# Patient Record
Sex: Female | Born: 1985 | Race: White | Hispanic: No | Smoking: Current every day smoker
Health system: Southern US, Community
[De-identification: ages and names within clinical notes are randomized; demographics above are authoritative.]

## PROBLEM LIST (undated history)

## (undated) DIAGNOSIS — F319 Bipolar disorder, unspecified: Secondary | ICD-10-CM

## (undated) DIAGNOSIS — F419 Anxiety disorder, unspecified: Secondary | ICD-10-CM

## (undated) DIAGNOSIS — F3181 Bipolar II disorder: Secondary | ICD-10-CM

## (undated) DIAGNOSIS — Z720 Tobacco use: Secondary | ICD-10-CM

## (undated) DIAGNOSIS — G43909 Migraine, unspecified, not intractable, without status migrainosus: Secondary | ICD-10-CM

## (undated) HISTORY — DX: Bipolar II disorder: F31.81

## (undated) HISTORY — DX: Migraine, unspecified, not intractable, without status migrainosus: G43.909

## (undated) HISTORY — DX: Tobacco use: Z72.0

## (undated) HISTORY — DX: Anxiety disorder, unspecified: F41.9

---
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
## 2007-05-06 ENCOUNTER — Emergency Department (HOSPITAL_COMMUNITY): Admission: EM | Admit: 2007-05-06 | Discharge: 2007-05-06 | Payer: Self-pay | Admitting: Emergency Medicine

## 2010-03-13 ENCOUNTER — Inpatient Hospital Stay (INDEPENDENT_AMBULATORY_CARE_PROVIDER_SITE_OTHER)
Admission: RE | Admit: 2010-03-13 | Discharge: 2010-03-13 | Disposition: A | Discharge status: Home / Self Care | Payer: Self-pay | Source: Ambulatory Visit | Attending: Emergency Medicine | Admitting: Emergency Medicine

## 2010-03-13 DIAGNOSIS — M79609 Pain in unspecified limb: Secondary | ICD-10-CM

## 2010-03-13 LAB — POCT I-STAT, CHEM 8
BUN: 11 mg/dL (ref 6–23)
Calcium, Ion: 1.18 mmol/L (ref 1.12–1.32)
Chloride: 105 mEq/L (ref 96–112)
Creatinine, Ser: 0.7 mg/dL (ref 0.4–1.2)
Glucose, Bld: 92 mg/dL (ref 70–99)
HCT: 38 % (ref 36.0–46.0)
Hemoglobin: 12.9 g/dL (ref 12.0–15.0)
Potassium: 3.9 mEq/L (ref 3.5–5.1)
Sodium: 141 mEq/L (ref 135–145)
TCO2: 22 mmol/L (ref 0–100)

## 2010-04-03 ENCOUNTER — Emergency Department (HOSPITAL_COMMUNITY): Payer: Self-pay

## 2010-04-03 ENCOUNTER — Emergency Department (HOSPITAL_COMMUNITY)
Admission: EM | Admit: 2010-04-03 | Discharge: 2010-04-03 | Disposition: A | Discharge status: Home / Self Care | Payer: Self-pay | Attending: Emergency Medicine | Admitting: Emergency Medicine

## 2010-04-03 DIAGNOSIS — R079 Chest pain, unspecified: Secondary | ICD-10-CM | POA: Insufficient documentation

## 2010-04-03 DIAGNOSIS — F17200 Nicotine dependence, unspecified, uncomplicated: Secondary | ICD-10-CM | POA: Insufficient documentation

## 2010-04-03 DIAGNOSIS — J069 Acute upper respiratory infection, unspecified: Secondary | ICD-10-CM | POA: Insufficient documentation

## 2010-04-03 DIAGNOSIS — B9789 Other viral agents as the cause of diseases classified elsewhere: Secondary | ICD-10-CM | POA: Insufficient documentation
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
## 2010-11-07 ENCOUNTER — Emergency Department (HOSPITAL_BASED_OUTPATIENT_CLINIC_OR_DEPARTMENT_OTHER)
Admission: EM | Admit: 2010-11-07 | Discharge: 2010-11-08 | Disposition: A | Discharge status: Home / Self Care | Payer: BC Managed Care – PPO | Attending: Emergency Medicine | Admitting: Emergency Medicine

## 2010-11-07 ENCOUNTER — Encounter: Payer: Self-pay | Admitting: *Deleted

## 2010-11-07 ENCOUNTER — Emergency Department (INDEPENDENT_AMBULATORY_CARE_PROVIDER_SITE_OTHER): Payer: BC Managed Care – PPO

## 2010-11-07 DIAGNOSIS — R0989 Other specified symptoms and signs involving the circulatory and respiratory systems: Secondary | ICD-10-CM

## 2010-11-07 DIAGNOSIS — J40 Bronchitis, not specified as acute or chronic: Secondary | ICD-10-CM | POA: Insufficient documentation

## 2010-11-07 DIAGNOSIS — R509 Fever, unspecified: Secondary | ICD-10-CM | POA: Insufficient documentation

## 2010-11-07 DIAGNOSIS — R05 Cough: Secondary | ICD-10-CM

## 2010-11-07 DIAGNOSIS — F319 Bipolar disorder, unspecified: Secondary | ICD-10-CM | POA: Insufficient documentation

## 2010-11-07 HISTORY — DX: Bipolar disorder, unspecified: F31.9

## 2010-11-07 LAB — PREGNANCY, URINE: Preg Test, Ur: NEGATIVE

## 2010-11-07 MED ORDER — ALBUTEROL SULFATE HFA 108 (90 BASE) MCG/ACT IN AERS
2.0000 | INHALATION_SPRAY | Freq: Once | RESPIRATORY_TRACT | Status: AC
  Administered 2010-11-07: 2 via RESPIRATORY_TRACT
  Filled 2010-11-07: qty 6.7

## 2010-11-07 NOTE — ED Notes (Signed)
Pt reports nausea, cough, fever, diarrhea x 2-3 days-
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
## 2010-11-08 LAB — URINALYSIS, ROUTINE W REFLEX MICROSCOPIC
Nitrite: NEGATIVE
Protein, ur: NEGATIVE mg/dL
Urobilinogen, UA: 1 mg/dL (ref 0.0–1.0)

## 2010-11-08 LAB — URINE MICROSCOPIC-ADD ON

## 2010-11-08 MED ORDER — BENZONATATE 100 MG PO CAPS: 100.0000 mg | ORAL_CAPSULE | Freq: Three times a day (TID) | ORAL | Status: AC | PRN

## 2010-11-08 MED ORDER — BENZONATATE 100 MG PO CAPS: 100.0000 mg | ORAL_CAPSULE | Freq: Three times a day (TID) | ORAL | Status: DC | PRN

## 2010-11-08 MED ORDER — AZITHROMYCIN 250 MG PO TABS: 250.0000 mg | ORAL_TABLET | Freq: Every day | ORAL | Status: AC

## 2010-11-08 MED ORDER — AZITHROMYCIN 250 MG PO TABS: 250.0000 mg | ORAL_TABLET | Freq: Every day | ORAL | Status: DC

## 2011-01-06 ENCOUNTER — Encounter: Payer: Self-pay | Admitting: Family Medicine

## 2011-01-19 ENCOUNTER — Ambulatory Visit (INDEPENDENT_AMBULATORY_CARE_PROVIDER_SITE_OTHER): Payer: BC Managed Care – PPO | Admitting: Family Medicine

## 2011-01-19 ENCOUNTER — Encounter: Payer: Self-pay | Admitting: Family Medicine

## 2011-01-19 VITALS — BP 138/84 | HR 81 | Ht 66.0 in | Wt 145.0 lb

## 2011-01-19 DIAGNOSIS — R5383 Other fatigue: Secondary | ICD-10-CM

## 2011-01-19 DIAGNOSIS — F3181 Bipolar II disorder: Secondary | ICD-10-CM

## 2011-01-19 DIAGNOSIS — R5381 Other malaise: Secondary | ICD-10-CM

## 2011-01-19 DIAGNOSIS — IMO0001 Reserved for inherently not codable concepts without codable children: Secondary | ICD-10-CM

## 2011-01-19 DIAGNOSIS — Z975 Presence of (intrauterine) contraceptive device: Secondary | ICD-10-CM

## 2011-01-19 DIAGNOSIS — M21612 Bunion of left foot: Secondary | ICD-10-CM | POA: Insufficient documentation

## 2011-01-19 DIAGNOSIS — F17200 Nicotine dependence, unspecified, uncomplicated: Secondary | ICD-10-CM

## 2011-01-19 DIAGNOSIS — M21619 Bunion of unspecified foot: Secondary | ICD-10-CM

## 2011-01-19 DIAGNOSIS — F3189 Other bipolar disorder: Secondary | ICD-10-CM

## 2011-01-19 DIAGNOSIS — Z720 Tobacco use: Secondary | ICD-10-CM

## 2011-01-19 HISTORY — DX: Bipolar II disorder: F31.81

## 2011-01-19 HISTORY — DX: Tobacco use: Z72.0

## 2011-01-19 LAB — CBC
HCT: 41 % (ref 36.0–46.0)
Hemoglobin: 13.5 g/dL (ref 12.0–15.0)
MCHC: 32.9 g/dL (ref 30.0–36.0)

## 2011-01-19 MED ORDER — LAMOTRIGINE 25 & 50 & 100 MG PO KIT: 100.0000 mg | PACK | Freq: Every day | ORAL | Status: DC

## 2011-01-19 MED ORDER — NICOTINE 7 MG/24HR TD PT24: 1.0000 | MEDICATED_PATCH | TRANSDERMAL | Status: AC

## 2011-01-19 NOTE — Assessment & Plan Note (Signed)
Patient is bunion on the left lateral aspect of her foot. Secondary to patient having a breakdown of her transverse arch. Patient was recommended to try some Spenco orthotics but she can get at home and to sports or online. Patient will try these in her work boots and see if it makes a significant improvement. Patient also given a bunion pad to try to wear to see that actually is if any benefit. Reassess in 2 weeks.

## 2011-01-19 NOTE — Assessment & Plan Note (Signed)
Patient is wanting to quit not ready to put a quit date though. Patient will be given nicotine patch. Patient will not be given Wellbutrin or Gentex secondary to her psychological condition. Patient though knows she needs to quit to have his ICD removed and be started on oral contraceptives.

## 2011-01-19 NOTE — Patient Instructions (Signed)
Very good to see you. I am giving you a Lamictal starting pack. Take as stated. We will get a complete blood count today. I will call you with the results You can always get Spenco orthotics these are available at [employer] I'm giving you a nicotine patch to try. I want to see you again in 2-4 weeks to make sure everything is going well. At that time please set this up for her Mirena removal and Pap smear.

## 2011-01-19 NOTE — Assessment & Plan Note (Signed)
Patient gives history of this. Didn't seem very concerned and very genuine. At this time I do think a mood stabilizer would be of benefit and will start Lamictal and start her kits dosing. Patient knows about the rash and what to look out for. In addition we will get a baseline CBC to make sure everything is fine. We'll have patient come back in the next 2-4 weeks for IUD removal discuss contraception and make sure medication is treating her well.

## 2011-01-19 NOTE — Progress Notes (Signed)
Subjective:    Patient ID: Monica Mcdonald, female    DOB: 1985-03-03, 26 y.o.   MRN: [id number]  HPI 26 year old female here to establish care. Patient is going to school in [place] previously and now has moved back to [place] to be closer to family. 1.  Pt IUD is almost expired, she would not like another one but would like to go back on the pill.  Pt though is still smoking 1/2 ppd.  Pt states that it is sometimes painful during intercourse and would not like it again. Pt has not had periods since having it put in and otherwise feels healthy pt is not planning on having anymore kids.  2. Tobacco abuse- Still smoking 1/2 ppd would like to quit and would like options, pt has a four year child she would like to quit for. Pt unable to set a quit date, has not tried to quit for real ever.  3.  Bipolar 2 disorder.  Pt has not bene on any medication for 2 years.  Diagnosed in [place] no manic episodes but feels down overall.  Pt states she has not had as much energy a she used to have and  Sometimes feels tearful though everything is good in her life. Pt denies  Suicidal and Homicidal ideation .  Pt would be interested in a mood stabilizer.    Review of Systems Denies fever, chills, nausea vomiting abdominal pain, dysuria, chest pain, shortness of breath dyspnea on exertion or numbness in extremities Past Medical History  Diagnosis Date  . Bipolar 1 disorder   . Migraines   . Bipolar 2 disorder 01/19/2011  . Tobacco abuse 01/19/2011  . Anxiety   . Migraines 2008   History reviewed. No pertinent past surgical history. History   Social History  . Marital Status: Divorced    Spouse Name: N/A    Number of Children: N/A  . Years of Education: N/A   Occupational History  . Not on file.   Social History Main Topics  . Smoking status: Current Everyday Smoker  . Smokeless tobacco: Never Used  . Alcohol Use: 2.0 oz/week    4 drink(s) per week  . Drug Use: No  . Sexually Active: Yes   Birth Control/ Protection: IUD   Other Topics Concern  . Not on file   Social History Narrative   Patient works for [employer]. Has a son born in 2008       Objective:   Physical Exam  Gen: NAD alert pleasant affect normal CV: RRR no murmur Pul: CTAB Abd: BS + NT Ext: no edema Left foot: has bunion on left fifth toe lateral, tender to palpation no erythema no signs of infection or skin breakdown.       Assessment & Plan:  Bipolar 2 disorder Patient gives history of this. Didn't seem very concerned and very genuine. At this time I do think a mood stabilizer would be of benefit and will start Lamictal and start her kits dosing. Patient knows about the rash and what to look out for. In addition we will get a baseline CBC to make sure everything is fine. We'll have patient come back in the next 2-4 weeks for IUD removal discuss contraception and make sure medication is treating her well.  Tobacco abuse Patient is wanting to quit not ready to put a quit date though. Patient will be given nicotine patch. Patient will not be given Wellbutrin or Gentex secondary to her psychological condition. Patient though knows  she needs to quit to have his ICD removed and be started on oral contraceptives.  Bunion of left foot Patient is bunion on the left lateral aspect of her foot. Secondary to patient having a breakdown of her transverse arch. Patient was recommended to try some Spenco orthotics but she can get at home and to sports or online. Patient will try these in her work boots and see if it makes a significant improvement. Patient also given a bunion pad to try to wear to see that actually is if any benefit. Reassess in 2 weeks.

## 2011-02-01 ENCOUNTER — Telehealth: Payer: Self-pay | Admitting: Family Medicine

## 2011-02-01 NOTE — Telephone Encounter (Signed)
Was told if she got a rash to call - she had been put on lamictal.  She has discovered a rash behind her ear, but it doesn't itch or burn

## 2011-02-01 NOTE — Telephone Encounter (Addendum)
Returned call to patient.  States she has a rash behind left ear.  Rash is 1.5" x 1.5", has bumps, and is slightly pink.  Denies itching, pain, or oozing. Also, denies facial swelling or shortness of breath.   Patient is not due to take Lamictal again until tomorrow morning.   Patient will wait to take medication after she hears back from our office.  Will check with Dr. Katrinka Blazing in morning and call patient back.  Gaylene Brooks, RN

## 2011-02-02 NOTE — Telephone Encounter (Signed)
Agreed with documentation, would be weird area for drug reaction rash, but would be conservative and if completely new then would need to stop medication and see how she does without it.

## 2011-02-02 NOTE — Telephone Encounter (Signed)
Spoke with Dr. Katrinka Blazing.  Will check with patient to see if she has ever had this type of rash.  If not, have patient hold med for 3-5 days to see if rash resolves.  Called patient back and left message on voicemail to call our office.  Gaylene Brooks, RN

## 2011-02-02 NOTE — Telephone Encounter (Signed)
Called patient again.  Patient states she had eczema as a child and does not think this is the same type of rash.  Instructed patient to hold Lamictal and see if rash resolves.  Patient will call office back on Friday (02/05/11) with status of rash and to see whether she needs to resume her med.  Gaylene Brooks, RN

## 2011-02-08 NOTE — Telephone Encounter (Signed)
Pt called back and said that rash had resolved, not sure what she should do now.  pls advise

## 2011-02-08 NOTE — Telephone Encounter (Signed)
Spoke with patient and she states rash is not completely gone but is probably 75 % better. Still itches a little. Will send message to Dr. Katrinka Blazing to please advise .

## 2011-02-10 NOTE — Telephone Encounter (Signed)
Called patient back a message stating that she did attempt to try to restart the medication and see if the rash starts again and then we'll discontinue. We'll not make allergy to the medication at this point and her chart thinking that this was unrelated with such a weird distribution.

## 2011-02-26 ENCOUNTER — Other Ambulatory Visit (HOSPITAL_COMMUNITY)
Admission: RE | Admit: 2011-02-26 | Discharge: 2011-02-26 | Disposition: A | Discharge status: Home / Self Care | Payer: BC Managed Care – PPO | Source: Ambulatory Visit | Attending: Family Medicine | Admitting: Family Medicine

## 2011-02-26 ENCOUNTER — Encounter: Payer: Self-pay | Admitting: Family Medicine

## 2011-02-26 ENCOUNTER — Ambulatory Visit (INDEPENDENT_AMBULATORY_CARE_PROVIDER_SITE_OTHER): Payer: BC Managed Care – PPO | Admitting: Family Medicine

## 2011-02-26 VITALS — BP 120/68 | HR 64 | Temp 98.7°F | Ht 66.0 in | Wt 142.1 lb

## 2011-02-26 DIAGNOSIS — Z124 Encounter for screening for malignant neoplasm of cervix: Secondary | ICD-10-CM

## 2011-02-26 DIAGNOSIS — F17200 Nicotine dependence, unspecified, uncomplicated: Secondary | ICD-10-CM

## 2011-02-26 DIAGNOSIS — Z113 Encounter for screening for infections with a predominantly sexual mode of transmission: Secondary | ICD-10-CM | POA: Insufficient documentation

## 2011-02-26 DIAGNOSIS — Z975 Presence of (intrauterine) contraceptive device: Secondary | ICD-10-CM

## 2011-02-26 DIAGNOSIS — Z720 Tobacco use: Secondary | ICD-10-CM

## 2011-02-26 DIAGNOSIS — F3181 Bipolar II disorder: Secondary | ICD-10-CM

## 2011-02-26 DIAGNOSIS — IMO0001 Reserved for inherently not codable concepts without codable children: Secondary | ICD-10-CM

## 2011-02-26 DIAGNOSIS — Z01419 Encounter for gynecological examination (general) (routine) without abnormal findings: Secondary | ICD-10-CM | POA: Insufficient documentation

## 2011-02-26 DIAGNOSIS — N760 Acute vaginitis: Secondary | ICD-10-CM

## 2011-02-26 DIAGNOSIS — F3189 Other bipolar disorder: Secondary | ICD-10-CM

## 2011-02-26 MED ORDER — NORETHINDRONE 0.35 MG PO TABS: 1.0000 | ORAL_TABLET | Freq: Every day | ORAL | Status: DC

## 2011-02-26 NOTE — Assessment & Plan Note (Signed)
Still smoking patient declined any counseling.

## 2011-02-26 NOTE — Progress Notes (Signed)
  Subjective:    Patient ID: Monica Mcdonald, female    DOB: [birth year], 26 y.o.   MRN: [id number]  HPI 26 year old female here today for an IUD removal as well as to discuss her bipolar 2 disorder. #1 IUD removal patient is a greater for 5 years it is time to be removed. Patient would like to not continue at this time and would like to go to oral contraceptives. Patient does smoke quarter pack per day and is still trying to cut back even more. #2 bipolar 2 disorder. Patient has had this diagnosed previously was put on Lamictal but there is a potential that she actually started to have a rash behind her ears bilaterally. She discontinued the medication for 4 days rash improved minorly but not completely, she started the Lamictal again and at that time patient has had no other side effects or any rash. Patient is on 50 mg a day and is going to continue to try to increase up to the 100 mg daily. Patient states that she's been feeling well denies any suicidal or homicidal ideation.  Review of Systems Denies fever, chills, nausea vomiting abdominal pain, dysuria, chest pain, shortness of breath dyspnea on exertion or numbness in extremities     Objective:   Physical Exam  Constitutional: She is oriented to person, place, and time. She appears well-developed.  HENT:  Head: Normocephalic.  Mouth/Throat: No oropharyngeal exudate.  Eyes: Conjunctivae are normal. Pupils are equal, round, and reactive to light.  Neck: Normal range of motion. Neck supple. No thyromegaly present.  Cardiovascular: Normal rate, regular rhythm and normal heart sounds.   Pulmonary/Chest: Effort normal and breath sounds normal.  Abdominal: Soft. Bowel sounds are normal. She exhibits no distension.  Genitourinary: Vagina normal and uterus normal.       Speculum placed patient has very normal appearing vaginal tissue cervix identified, patient Mirena strings are in place but has a bowl of straining and shows that it was not cut  during insertion.Pap collected, Mirena removed. Patient tolerated procedure well.  Musculoskeletal: Normal range of motion.  Neurological: She is alert and oriented to person, place, and time. She has normal reflexes.          Assessment & Plan:

## 2011-02-26 NOTE — Patient Instructions (Signed)
It is great to see you. I am glad to the Lamictalis helping.I will refill it when it is time. I have giving you a medicine called Micronor. Take one pill daily at the same time. I should probably see you again in about one to 2 months to make sure the [place] is still doing well.

## 2011-02-26 NOTE — Assessment & Plan Note (Signed)
Discussed with patient again at this time patient is doing very well with the Lamictal and increasing without any side effects. We'll continue to monitor for rash gave patient red flags and when to seek medical attention. Patient will followup with me in one month's time to make sure still doing well.

## 2011-03-31 ENCOUNTER — Other Ambulatory Visit: Payer: Self-pay | Admitting: Family Medicine

## 2011-03-31 MED ORDER — LAMOTRIGINE 100 MG PO TABS: 100.0000 mg | ORAL_TABLET | Freq: Every day | ORAL | Status: DC

## 2011-04-26 ENCOUNTER — Ambulatory Visit: Payer: BC Managed Care – PPO | Admitting: Family Medicine

## 2011-08-31 ENCOUNTER — Encounter (HOSPITAL_COMMUNITY): Payer: Self-pay | Admitting: Emergency Medicine

## 2011-08-31 ENCOUNTER — Emergency Department (HOSPITAL_COMMUNITY)
Admission: EM | Admit: 2011-08-31 | Discharge: 2011-08-31 | Disposition: A | Discharge status: Home / Self Care | Payer: Worker's Compensation | Attending: Emergency Medicine | Admitting: Emergency Medicine

## 2011-08-31 ENCOUNTER — Emergency Department (HOSPITAL_COMMUNITY): Payer: Worker's Compensation

## 2011-08-31 DIAGNOSIS — F319 Bipolar disorder, unspecified: Secondary | ICD-10-CM | POA: Insufficient documentation

## 2011-08-31 DIAGNOSIS — S93409A Sprain of unspecified ligament of unspecified ankle, initial encounter: Secondary | ICD-10-CM | POA: Insufficient documentation

## 2011-08-31 DIAGNOSIS — S93402A Sprain of unspecified ligament of left ankle, initial encounter: Secondary | ICD-10-CM

## 2011-08-31 DIAGNOSIS — F17200 Nicotine dependence, unspecified, uncomplicated: Secondary | ICD-10-CM | POA: Insufficient documentation

## 2011-08-31 DIAGNOSIS — Z79899 Other long term (current) drug therapy: Secondary | ICD-10-CM | POA: Insufficient documentation

## 2011-08-31 DIAGNOSIS — W19XXXA Unspecified fall, initial encounter: Secondary | ICD-10-CM | POA: Insufficient documentation

## 2011-08-31 MED ORDER — HYDROCODONE-ACETAMINOPHEN 5-325 MG PO TABS
1.0000 | ORAL_TABLET | Freq: Once | ORAL | Status: AC
  Administered 2011-08-31: 1 via ORAL
  Filled 2011-08-31: qty 1

## 2011-08-31 MED ORDER — HYDROCODONE-ACETAMINOPHEN 5-325 MG PO TABS: 1.0000 | ORAL_TABLET | Freq: Four times a day (QID) | ORAL | Status: AC | PRN

## 2011-08-31 MED ORDER — IBUPROFEN 800 MG PO TABS: 800.0000 mg | ORAL_TABLET | Freq: Three times a day (TID) | ORAL | Status: AC

## 2011-08-31 MED ORDER — IBUPROFEN 800 MG PO TABS
800.0000 mg | ORAL_TABLET | Freq: Once | ORAL | Status: AC
  Administered 2011-08-31: 800 mg via ORAL
  Filled 2011-08-31: qty 1

## 2011-08-31 NOTE — ED Notes (Signed)
Rx given x2 D/c instructions reviewed w/ pt - pt denies any further questions or concerns at present.  Pt ambulating w/ assistance of crutches - pt in no acute distress - A&Ox4.

## 2011-08-31 NOTE — ED Provider Notes (Signed)
History     CSN: [id number]  Arrival date & time 08/31/11  2007   First MD Initiated Contact with Patient 08/31/11 2229      Chief Complaint  Patient presents with  . Ankle Injury    (Consider location/radiation/quality/duration/timing/severity/associated sxs/prior treatment) Patient is a 26 y.o. female presenting with lower extremity injury. The history is provided by the patient.  Ankle Injury This is a new problem. The current episode started today. The problem occurs constantly. The problem has been unchanged. Associated symptoms include joint swelling. Pertinent negatives include no abdominal pain, chest pain, chills, congestion, diaphoresis, fever, headaches, myalgias, nausea, numbness, rash, sore throat or weakness. Nothing aggravates the symptoms. She has tried nothing for the symptoms.    Past Medical History  Diagnosis Date  . Bipolar 1 disorder   . Migraines   . Bipolar 2 disorder 01/19/2011  . Tobacco abuse 01/19/2011  . Anxiety   . Migraines 2008    History reviewed. No pertinent past surgical history.  Family History  Problem Relation Age of Onset  . Alcohol abuse Maternal Grandmother   . Heart failure Maternal Grandmother   . Hyperlipidemia Maternal Grandmother   . Hypertension Maternal Grandmother   . Hypertension Mother   . Hyperlipidemia Mother   . Hyperlipidemia Father   . Hypertension Father   . Diabetes Mother     mother is controlling with diet  . Stroke Maternal Grandmother   . Thyroid disease Maternal Grandmother   . Cancer Neg Hx     History  Substance Use Topics  . Smoking status: Current Everyday Smoker -- 0.3 packs/day  . Smokeless tobacco: Never Used  . Alcohol Use: 2.0 oz/week    4 drink(s) per week     occ    OB History    Grav Para Term Preterm Abortions TAB SAB Ect Mult Living                  Review of Systems  Constitutional: Negative for fever, chills, diaphoresis and appetite change.  HENT: Negative for congestion  and sore throat.   Eyes: Negative for visual disturbance.  Respiratory: Negative for shortness of breath.   Cardiovascular: Negative for chest pain and leg swelling.  Gastrointestinal: Negative for nausea and abdominal pain.  Genitourinary: Negative for dysuria, urgency and frequency.  Musculoskeletal: Positive for joint swelling and gait problem. Negative for myalgias.  Skin: Negative for rash.  Neurological: Negative for dizziness, syncope, weakness, light-headedness, numbness and headaches.  Psychiatric/Behavioral: Negative for confusion.  All other systems reviewed and are negative.    Allergies  Penicillins cross reactors and Naproxen sodium  Home Medications   Current Outpatient Rx  Name Route Sig Dispense Refill  . LAMOTRIGINE 100 MG PO TABS Oral Take 1 tablet (100 mg total) by mouth daily. 90 tablet 1  . NORETHINDRONE 0.35 MG PO TABS Oral Take 1 tablet (0.35 mg total) by mouth daily. 3 Package 3  . HYDROCODONE-ACETAMINOPHEN 5-325 MG PO TABS Oral Take 1 tablet by mouth every 6 (six) hours as needed for pain. 15 tablet 0  . IBUPROFEN 800 MG PO TABS Oral Take 1 tablet (800 mg total) by mouth 3 (three) times daily. 21 tablet 0    BP 126/82  Pulse 84  Temp 98.3 F (36.8 C) (Oral)  Resp 18  SpO2 100%  LMP 08/05/2011  Physical Exam  Nursing note and vitals reviewed. Constitutional: She is oriented to person, place, and time. She appears well-developed and well-nourished. No distress.  HENT:  Head: Normocephalic and atraumatic.  Eyes: Conjunctivae and EOM are normal.  Neck: Normal range of motion.  Cardiovascular:       Intact distal pulses  Pulmonary/Chest: Effort normal.  Musculoskeletal: Normal range of motion.       Tenderness to palpation over lateral malleolus.  Mild swelling of the ankle joint.  Pain with inversion and eversion of ankle.  Normal range of motion of toes.  No tenderness to palpation over metatarsal region. No proximal fibula tenderness    Neurological: She is alert and oriented to person, place, and time.       Strength 5 out of 5 in extremities bilaterally. sensation intact  Skin: Skin is warm and dry. No rash noted. She is not diaphoretic.  Psychiatric: She has a normal mood and affect. Her behavior is normal.    ED Course  Procedures (including critical care time)  Labs Reviewed - No data to display Dg Ankle Complete Left  08/31/2011  *RADIOLOGY REPORT*  Clinical Data: Fall, left lateral ankle pain  LEFT ANKLE COMPLETE - 3+ VIEW  Comparison: None.  Findings: Mild soft tissue swelling about the anterior ankle and lateral malleolus without evidence of underlying acute fracture, or malalignment.  The ankle mortise is congruent.  No ankle joint effusion.  IMPRESSION:  Soft tissue swelling of the anterolateral ankle without evidence of underlying fracture, or malalignment.   Original Report Authenticated By: HEATH      1. Left ankle sprain       MDM  Ankle sprain  Patient X-Ray negative for obvious fracture or dislocation. Pain managed in ED. Pt advised to follow up with orthopedics if symptoms persist for possibility of missed fracture diagnosis. Patient given brace while in ED, conservative therapy recommended and discussed. Patient will be dc home & is agreeable with above plan.         Jaci Carrel, [place] 08/31/11 2340

## 2011-08-31 NOTE — ED Notes (Signed)
Pt states she was caught her foot in a crack on the sidewalk and rolled her left foot  Pt states she felt and heard it crack  Pt unable to bear weight on it  Pt has swelling noted to the outside of her left ankle  Ice pack in place

## 2011-08-31 NOTE — ED Notes (Signed)
Pt reports while at work her left foot got caught in a crack in the ground causing her to over extend her foot and injure her left ankle. Pt w/o any obvious deformity or swelling on assessment.

## 2011-09-01 NOTE — ED Provider Notes (Signed)
Medical screening examination/treatment/procedure(s) were performed by non-physician practitioner and as supervising physician I was immediately available for consultation/collaboration.  Georgi Tuel R. Mirissa Lopresti, MD 09/01/11 0007 

## 2011-09-15 ENCOUNTER — Encounter: Payer: Self-pay | Admitting: Family Medicine

## 2011-09-15 ENCOUNTER — Ambulatory Visit (INDEPENDENT_AMBULATORY_CARE_PROVIDER_SITE_OTHER): Payer: BC Managed Care – PPO | Admitting: Family Medicine

## 2011-09-15 VITALS — BP 130/85 | HR 89 | Temp 97.9°F

## 2011-09-15 DIAGNOSIS — Z309 Encounter for contraceptive management, unspecified: Secondary | ICD-10-CM

## 2011-09-15 MED ORDER — TRAMADOL HCL 50 MG PO TABS: 50.0000 mg | ORAL_TABLET | Freq: Three times a day (TID) | ORAL | Status: AC | PRN

## 2011-09-15 MED ORDER — LEVONORGESTREL 20 MCG/24HR IU IUD
INTRAUTERINE_SYSTEM | Freq: Once | INTRAUTERINE | Status: AC
  Administered 2011-09-15: 1 via INTRAUTERINE

## 2011-09-15 NOTE — Patient Instructions (Signed)
I called in some tramadol to your pharmacy to help with your foot pain.  You can either take meloxicam or ibuprofen, but not both.   You may have some cramping today and some light bleeding but this should go away in 24 to 48 hours.  If you have any questions or problems, please call the office.

## 2011-09-15 NOTE — Progress Notes (Signed)
IUD Insertion Procedure Note  Pre-operative Diagnosis: Need for Contraception  Post-operative Diagnosis: same  Indications: contraception  Procedure Details  Urine pregnancy test was done today and result was negative.  The risks (including infection, bleeding, pain, and uterine perforation) and benefits of the procedure were explained to the patient and Written informed consent was obtained.    Cervix cleansed with Betadine. Uterus sounded to 8 cm. IUD inserted without difficulty. String visible and trimmed. Patient tolerated procedure well.  IUD Information: Mirena.  Condition: Stable  Complications: None  Plan:  The patient was advised to call for any fever or for prolonged or severe pain or bleeding. She was advised to use OTC ibuprofen as needed for mild to moderate pain.

## 2011-09-15 NOTE — Progress Notes (Signed)
  Subjective:    Patient ID: Monica Mcdonald, female    DOB: 1985/04/24, 24 y.o.   MRN: [id number]  HPI  Monica Mcdonald comes in because she would like another Mirena.  She had her IUD taken out a few months back because it had been in for 5 years, and has been taking oral contraceptive pills since.  However, she says she is having difficulty remembering to take the pills regularly, so she wants to get another Mirena.    She also lets me know that she recently fell at work and broke her L foot and ankle.  She is seeing [facility]- who had switched her pain medications from Vicodin and ibuprofen to Meloxicam.  She is having significant pain on meloxicam, which is making it difficult to care for her son.   Review of Systems See HPI    Objective:   Physical Exam BP 130/85  Pulse 89  Temp 97.9 F (36.6 C) (Oral)  LMP 09/01/2011 General appearance: alert, cooperative and no distress Pelvic: cervix normal in appearance, external genitalia normal, no adnexal masses or tenderness, no cervical motion tenderness, rectovaginal septum normal, uterus normal size, shape, and consistency and vagina normal without discharge        Assessment & Plan:

## 2012-03-10 ENCOUNTER — Emergency Department (HOSPITAL_COMMUNITY)
Admission: EM | Admit: 2012-03-10 | Discharge: 2012-03-10 | Disposition: A | Discharge status: Home / Self Care | Payer: BC Managed Care – PPO | Attending: Emergency Medicine | Admitting: Emergency Medicine

## 2012-03-10 ENCOUNTER — Encounter (HOSPITAL_COMMUNITY): Payer: Self-pay | Admitting: *Deleted

## 2012-03-10 ENCOUNTER — Emergency Department (HOSPITAL_COMMUNITY): Payer: BC Managed Care – PPO

## 2012-03-10 DIAGNOSIS — Y9301 Activity, walking, marching and hiking: Secondary | ICD-10-CM | POA: Insufficient documentation

## 2012-03-10 DIAGNOSIS — S4980XA Other specified injuries of shoulder and upper arm, unspecified arm, initial encounter: Secondary | ICD-10-CM | POA: Insufficient documentation

## 2012-03-10 DIAGNOSIS — Z79899 Other long term (current) drug therapy: Secondary | ICD-10-CM | POA: Insufficient documentation

## 2012-03-10 DIAGNOSIS — R209 Unspecified disturbances of skin sensation: Secondary | ICD-10-CM | POA: Insufficient documentation

## 2012-03-10 DIAGNOSIS — Y9289 Other specified places as the place of occurrence of the external cause: Secondary | ICD-10-CM | POA: Insufficient documentation

## 2012-03-10 DIAGNOSIS — Z8659 Personal history of other mental and behavioral disorders: Secondary | ICD-10-CM | POA: Insufficient documentation

## 2012-03-10 DIAGNOSIS — M79601 Pain in right arm: Secondary | ICD-10-CM

## 2012-03-10 DIAGNOSIS — Z8679 Personal history of other diseases of the circulatory system: Secondary | ICD-10-CM | POA: Insufficient documentation

## 2012-03-10 DIAGNOSIS — S46909A Unspecified injury of unspecified muscle, fascia and tendon at shoulder and upper arm level, unspecified arm, initial encounter: Secondary | ICD-10-CM | POA: Insufficient documentation

## 2012-03-10 DIAGNOSIS — IMO0002 Reserved for concepts with insufficient information to code with codable children: Secondary | ICD-10-CM | POA: Insufficient documentation

## 2012-03-10 DIAGNOSIS — F17200 Nicotine dependence, unspecified, uncomplicated: Secondary | ICD-10-CM | POA: Insufficient documentation

## 2012-03-10 MED ORDER — HYDROCODONE-ACETAMINOPHEN 5-325 MG PO TABS: 2.0000 | ORAL_TABLET | ORAL | Status: DC | PRN

## 2012-03-10 MED ORDER — DIAZEPAM 5 MG PO TABS: 5.0000 mg | ORAL_TABLET | Freq: Four times a day (QID) | ORAL | Status: DC | PRN
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
## 2012-03-11 NOTE — ED Provider Notes (Signed)
History/physical exam/procedure(s) were performed by non-physician practitioner and as supervising physician I was immediately available for consultation/collaboration. I have reviewed all notes and am in agreement with care and plan.   Hilario Quarry, MD 03/11/12 [phone]

## 2012-04-05 ENCOUNTER — Ambulatory Visit: Payer: Self-pay | Admitting: Family Medicine

## 2012-07-06 ENCOUNTER — Encounter: Payer: Self-pay | Admitting: Family Medicine

## 2012-07-06 ENCOUNTER — Telehealth: Payer: Self-pay | Admitting: Family Medicine

## 2012-07-06 ENCOUNTER — Ambulatory Visit (INDEPENDENT_AMBULATORY_CARE_PROVIDER_SITE_OTHER): Payer: BC Managed Care – PPO | Admitting: Family Medicine

## 2012-07-06 VITALS — BP 121/87 | HR 68 | Temp 97.8°F | Ht 66.0 in | Wt 145.0 lb

## 2012-07-06 DIAGNOSIS — N898 Other specified noninflammatory disorders of vagina: Secondary | ICD-10-CM | POA: Insufficient documentation

## 2012-07-06 DIAGNOSIS — R350 Frequency of micturition: Secondary | ICD-10-CM

## 2012-07-06 DIAGNOSIS — N912 Amenorrhea, unspecified: Secondary | ICD-10-CM

## 2012-07-06 LAB — POCT URINALYSIS DIPSTICK
Bilirubin, UA: NEGATIVE
Glucose, UA: NEGATIVE
Nitrite, UA: NEGATIVE

## 2012-07-06 LAB — POCT WET PREP (WET MOUNT): WBC, Wet Prep HPF POC: >20

## 2012-07-06 LAB — POCT UA - MICROSCOPIC ONLY: Epithelial cells, urine per micros: >20

## 2012-07-06 LAB — POCT URINE PREGNANCY: Preg Test, Ur: NEGATIVE

## 2012-07-06 MED ORDER — FLUCONAZOLE 150 MG PO TABS: 150.0000 mg | ORAL_TABLET | Freq: Once | ORAL | Status: AC

## 2012-07-06 MED ORDER — NITROFURANTOIN MONOHYD MACRO 100 MG PO CAPS: 100.0000 mg | ORAL_CAPSULE | Freq: Two times a day (BID) | ORAL | Status: AC

## 2012-07-06 MED ORDER — METRONIDAZOLE 500 MG PO TABS: 500.0000 mg | ORAL_TABLET | Freq: Two times a day (BID) | ORAL | Status: DC

## 2012-07-06 NOTE — Assessment & Plan Note (Signed)
A: negative diabetes screen. ? UTI. Sample was not a clean catch. Discussed treating vs not with patient. Patient would like to treat. P: Macrobid.

## 2012-07-06 NOTE — Progress Notes (Signed)
Subjective:     Patient ID: Monica Mcdonald, female   DOB: 20-Nov-1985, 27 y.o.   MRN: [id number]  HPI 27 year old female presents for same-day visit to discuss the following  #1 amenorrhea: LMP was 05/19/2012. She normally has a period that lasts 5-7 days each month. She had 1 day of spotting on 06/04/2012. She does have an IUD that has been in place for the past year. Patient had one positive and one negative home pregnancy test. She is sexually active. She admits to nausea without vomiting and urinary frequency without dysuria. She admits to scant vaginal discharge with associated odor. She denies breast pain or tenderness.  Review of Systems As per HPI     Objective:   Physical Exam BP 121/87  Pulse 68  Temp(Src) 97.8 F (36.6 C) (Oral)  Ht 5\' 6"  (1.676 m)  Wt 145 lb (65.772 kg)  BMI 23.41 kg/m2  LMP 05/19/2012 Head: Normocephalic, without obvious abnormality, atraumatic Abdomen: soft, non-tender; bowel sounds normal; no masses,  no organomegaly, suprapubic discomfort to palpation w/o rebound or guarding.  Pelvic: external genitalia normal, labial piercing noted,  vaginal discharge is scant white, mixed thin and curdlike. IUD strings visualized from cervix. Cervix is normal w/o ectropia. There is no CMT. Uterus is anteverted. No adnexal mass or tenderness.   Urine pregnancy test: negative Urinalysis    Component Value Date/Time   COLORURINE YELLOW 11/07/2010 2320   APPEARANCEUR CLOUDY* 11/07/2010 2320   LABSPEC 1.030 11/07/2010 2320   PHURINE 6.0 11/07/2010 2320   GLUCOSEU NEGATIVE 11/07/2010 2320   HGBUR MODERATE* 11/07/2010 2320   BILIRUBINUR NEG 07/06/2012 1108   BILIRUBINUR SMALL* 11/07/2010 2320   KETONESUR 15* 11/07/2010 2320   PROTEINUR NEGATIVE 11/07/2010 2320   UROBILINOGEN 0.2 07/06/2012 1108   UROBILINOGEN 1.0 11/07/2010 2320   NITRITE NEG 07/06/2012 1108   NITRITE NEGATIVE 11/07/2010 2320   LEUKOCYTESUR large (3+) 07/06/2012 1108   Wet prep: BV    Assessment and Plan:

## 2012-07-06 NOTE — Patient Instructions (Addendum)
  Ms. Dillingham thank you for coming in today. Your pregnancy test is negative. There is no glucose in her urine.  You do have some white cells in your urine is suggestive of bacteria. You do have the option of treating her with antibiotics that is up to you.   Regarding diabetes, And checking an A1c today.  Regarding vaginal discharge, wet prep is pending I will call you with results.  Dr. Armen Pickup

## 2012-07-06 NOTE — Telephone Encounter (Signed)
Called patient. Macrobid for ? UTI. Followed by Flalgyl for BV. Followed by diflucan.   She agreed with plan and voiced understanding.

## 2012-07-06 NOTE — Assessment & Plan Note (Addendum)
A: BV P: treat with flagyl followed by diflucan.

## 2013-03-09 ENCOUNTER — Ambulatory Visit (INDEPENDENT_AMBULATORY_CARE_PROVIDER_SITE_OTHER): Payer: BC Managed Care – PPO | Admitting: Family Medicine

## 2013-03-09 ENCOUNTER — Other Ambulatory Visit (HOSPITAL_COMMUNITY)
Admission: RE | Admit: 2013-03-09 | Discharge: 2013-03-09 | Disposition: A | Discharge status: Home / Self Care | Payer: BC Managed Care – PPO | Source: Ambulatory Visit | Attending: Family Medicine | Admitting: Family Medicine

## 2013-03-09 ENCOUNTER — Encounter: Payer: Self-pay | Admitting: Family Medicine

## 2013-03-09 VITALS — BP 124/80 | HR 100 | Temp 98.3°F | Wt 156.0 lb

## 2013-03-09 DIAGNOSIS — R109 Unspecified abdominal pain: Secondary | ICD-10-CM

## 2013-03-09 DIAGNOSIS — R102 Pelvic and perineal pain unspecified side: Secondary | ICD-10-CM | POA: Insufficient documentation

## 2013-03-09 DIAGNOSIS — N898 Other specified noninflammatory disorders of vagina: Secondary | ICD-10-CM

## 2013-03-09 DIAGNOSIS — N949 Unspecified condition associated with female genital organs and menstrual cycle: Secondary | ICD-10-CM

## 2013-03-09 DIAGNOSIS — Z113 Encounter for screening for infections with a predominantly sexual mode of transmission: Secondary | ICD-10-CM | POA: Insufficient documentation

## 2013-03-09 LAB — POCT UA - MICROSCOPIC ONLY

## 2013-03-09 LAB — POCT URINE PREGNANCY: Preg Test, Ur: NEGATIVE

## 2013-03-09 LAB — POCT URINALYSIS DIPSTICK
Bilirubin, UA: NEGATIVE
Glucose, UA: NEGATIVE
LEUKOCYTES UA: NEGATIVE
NITRITE UA: NEGATIVE
PH UA: 6
PROTEIN UA: NEGATIVE
Spec Grav, UA: 1.025
UROBILINOGEN UA: 0.2

## 2013-03-09 LAB — POCT WET PREP (WET MOUNT): CLUE CELLS WET PREP WHIFF POC: POSITIVE

## 2013-03-09 MED ORDER — METRONIDAZOLE 500 MG PO TABS: 500.0000 mg | ORAL_TABLET | Freq: Two times a day (BID) | ORAL | Status: AC

## 2013-03-09 NOTE — Assessment & Plan Note (Signed)
Mild, present 1.5 weeks, ddx includes pelvic infection/PID, GI/constipation, vaginitis with mild vaginal discharge, functional pain with h/o bipolar d/o, or other STD. No cervical motion tenderness, and IUD appears in proper position with strings visualized. Pregnancy unlikely given IUD. - Wet prep, UA, and urine pregnancy negative except for BV - pt desires treatment. Sent flagyl BID to pharmacy. - GC/Chlamydia pending. - Also testing HIV/RPR while testing other STDs. - Likely ectropion - pap 2013 normal. Next pap in 1 year. Consider sooner and pelvic US if pelvic symptoms not improving. - Tylenol or ibuprofen PRN abdominal pain.

## 2013-03-09 NOTE — Progress Notes (Signed)
Patient ID: Monica Mcdonald, female   DOB: [birth year], 28 y.o.   MRN: [id number] Subjective:   CC: Lower abdominal/pelvic pain  HPI:   Patient presents with 1.5 weeks of lower abdominal/pelvic pain that started during sex, feeling "crampy" like "he hit something." Pain turned into a dull ache with occasionally random worsening. She also intermittently felt mildly warm, had mild vaginal "chalky" mildly malodorous discharge worsened by bathing too frequently, but denies current dysuria, diarrhea, change in PO, change in void/stools. She has chronic nausea that is unchanged. She denies recent medication change. She has 2nd IUD in place for last 1 year, and she wonders if it could be causing current symptoms. She has had mild spotting 2 weeks ago but otherwise has not had periods in some time since getting IUD. She had negative GC/Chlamydia and pap smear in 07/2011.  She denies h/o STD.  Review of Systems - Per HPI.   SH: Sexually active for years with 1 female partner Ex-partner had genital herpes   Objective:  Physical Exam BP 124/80  Pulse 100  Temp(Src) 98.3 F (36.8 C) (Oral)  Wt 156 lb (70.761 kg) GEN: NAD HEENT: Atraumatic, normocephalic, neck supple, EOMI, sclera clear  CV: RRR, no murmurs, rubs, or gallops, 2+ bilateral PT pulses PULM: CTAB, normal effort ABD: Soft, mildly tender suprapubically, nondistended, NABS, no organomegaly SKIN: No rash or cyanosis; warm and well-perfused EXTR: No lower extremity edema or calf tenderness PSYCH: Mood and affect euthymic, normal rate and volume of speech NEURO: Awake, alert, no focal deficits grossly, normal speech GU: Vaginal normal appearing externally with 2mm mole right inner upper thigh; vaginal mucosa normal-appearing; thick light yellow discharge present; cervix visualized with 2cm of IUD strings protruding; no cervical discharge; mild ectropion-like appearance, no cervical motion tenderness, bimanual exam cervix, uterus, and adnexae palpated  wnl, mild lower abdominal discomfort with palpation.    Assessment:     Monica Mosellemie S Sweda is a 28 y.o. female with h/o IUD x 6 years (2nd IUD x 1 year) here for lower abdominal discomfort.    Plan:     # See problem list and after visit summary for problem-specific plans.  # Health Maintenance: STD testing  Follow-up: Follow up in 1 week if symptoms do not improve.   Leona SingletonMaria T Alianna Wurster, MD [facility]

## 2013-03-09 NOTE — Patient Instructions (Signed)
Good to meet you today.  We are doing some testing to evaluate your symptoms. Your IUD looks like it is in the right position and there is no cervical discharge that makes me worry about a uterine infection. I will call you with these results - they can take 1-2 days to come back. In the meantime, you can use ibuprofen or tylenol for your abdominal pain. Take with food. Come back in 1 week if symptoms worsen to include severe abdominal pain, worsening discharge, or high fevers.   Be well.  Leona SingletonMaria T Shaylynne Lunt, MD

## 2013-03-10 LAB — RPR

## 2013-03-10 LAB — HIV ANTIBODY (ROUTINE TESTING W REFLEX): HIV: NONREACTIVE

## 2013-03-12 ENCOUNTER — Telehealth: Payer: Self-pay | Admitting: Family Medicine

## 2013-03-12 LAB — CERVICOVAGINAL ANCILLARY ONLY
Chlamydia: NEGATIVE
Neisseria Gonorrhea: NEGATIVE

## 2013-03-12 NOTE — Telephone Encounter (Signed)
Called to let patient know that her labs from recent visit were all normal (other than BV we had already discussed) except pending gonorrhea/chlamydia. I let her know I would call her if this came back ABnormal but otherwise would not call her. I also reminded her that she is due for pap smear in 02/2014 and to come back if discomfort of lower abdomen did not resolve.  Leona SingletonMaria T Sundee Garland, MD

## 2013-04-26 IMAGING — CR DG SHOULDER 2+V*R*
3 series · 3 of 3 positions shown · non-contrast
Comparison: None.

CLINICAL DATA: Trauma and pain.

RIGHT SHOULDER - 2+ VIEW

[w shoulder external right]
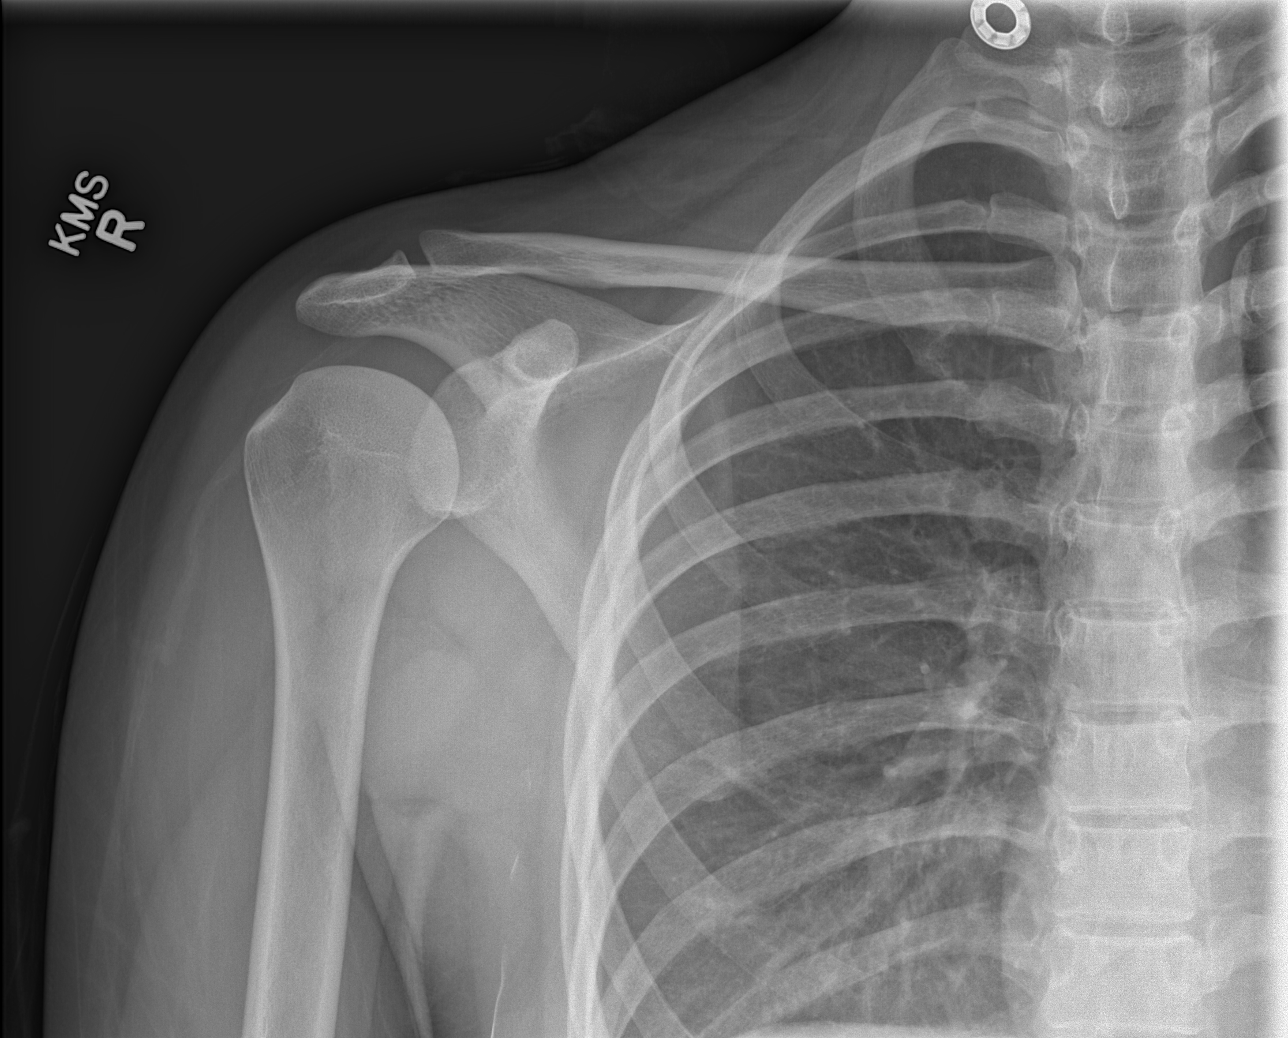

[w shoulder internal right]
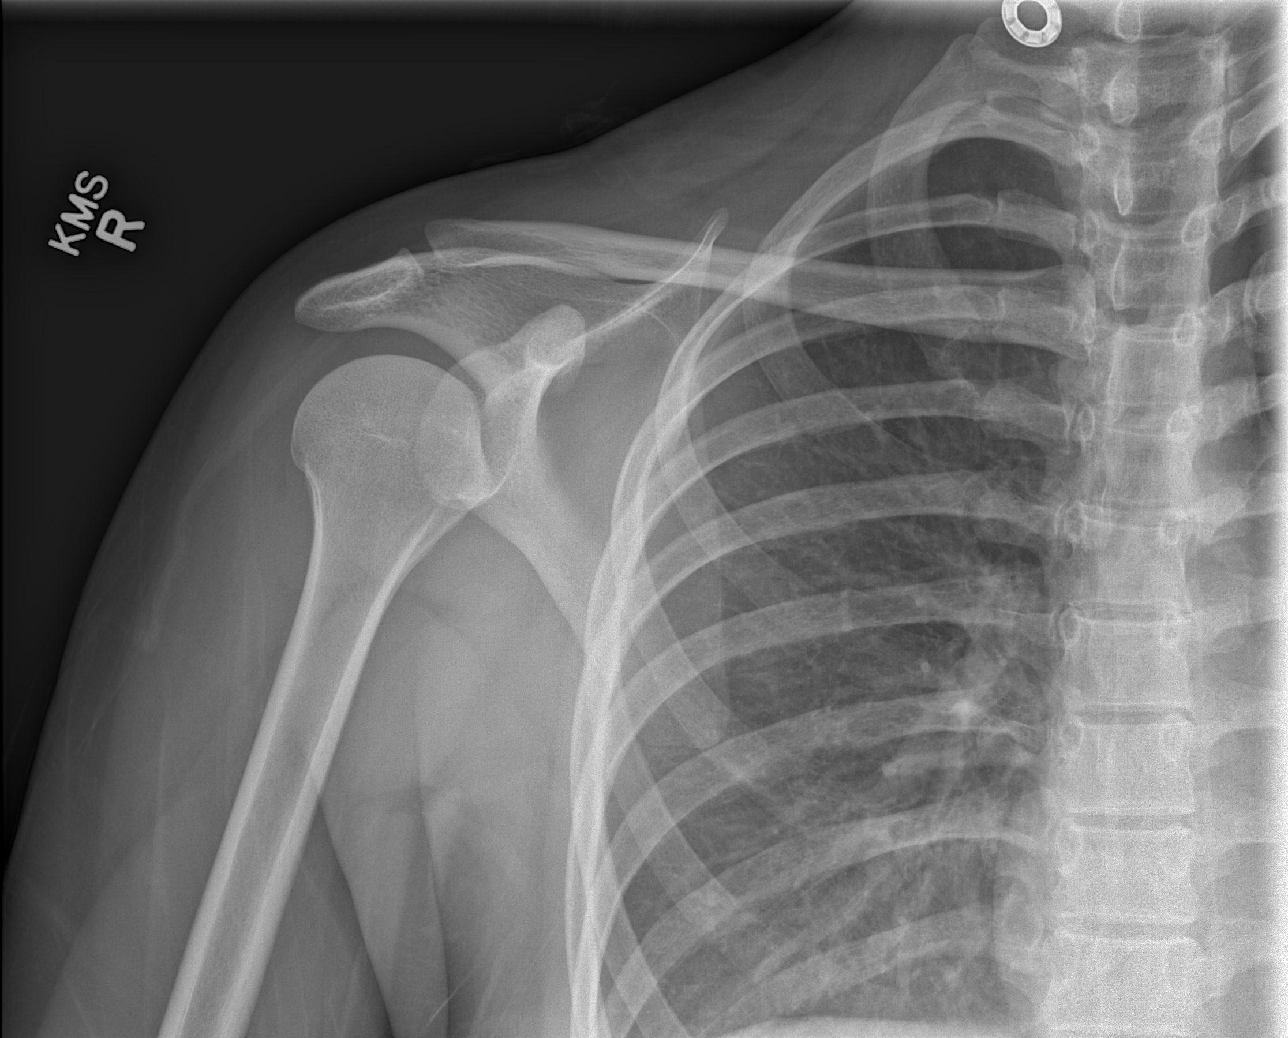

[w shoulder y-view right]
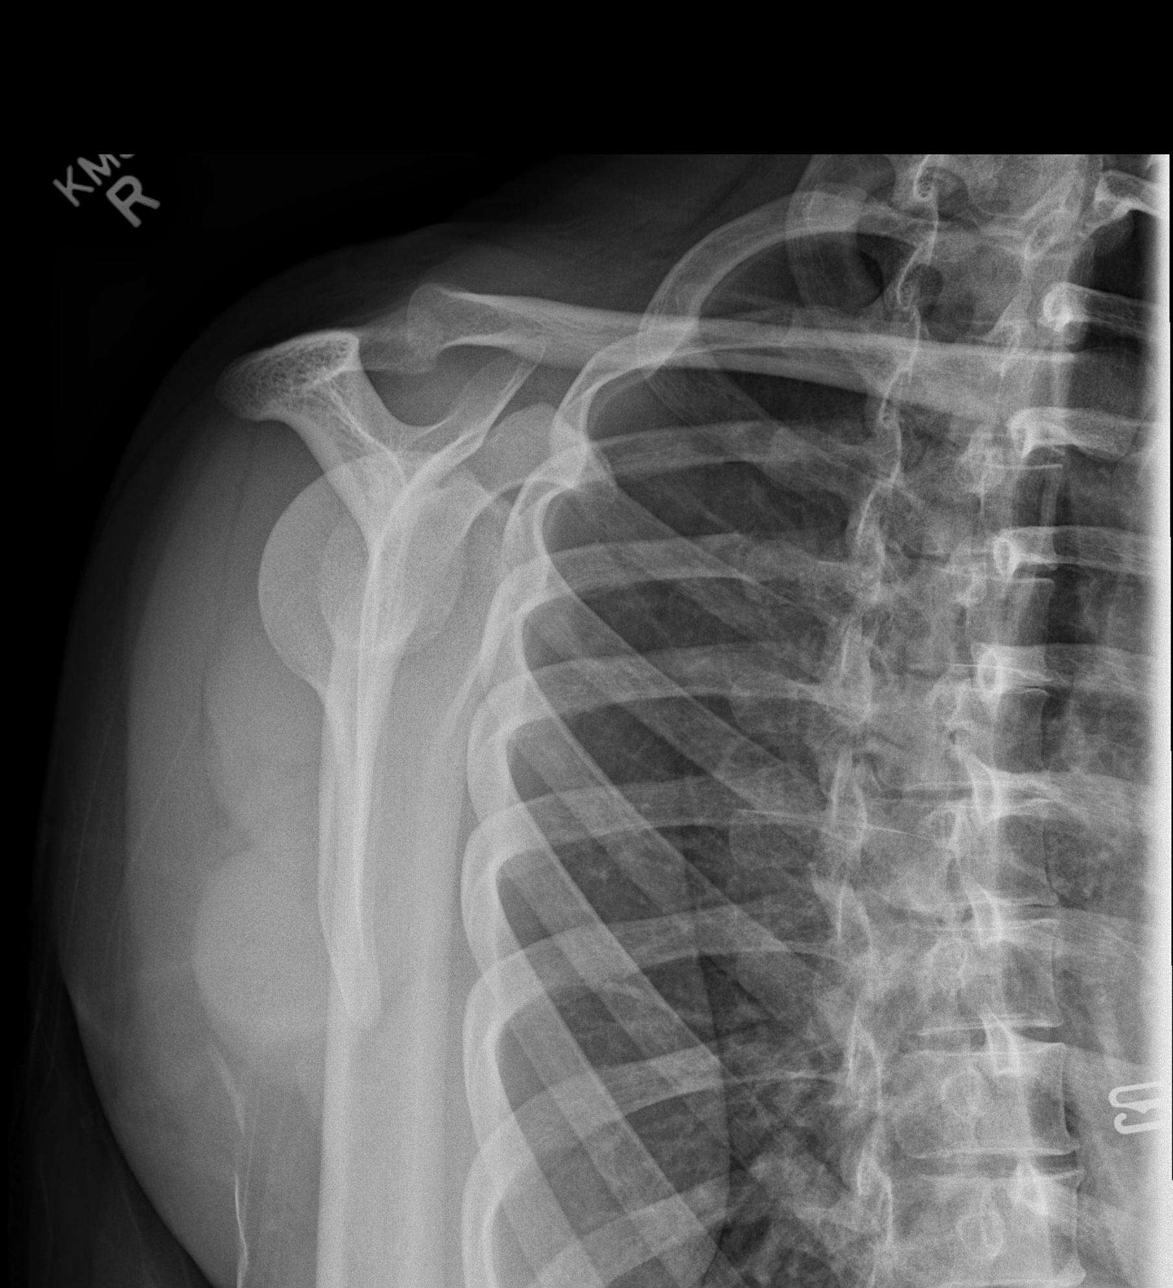

[3 of 3 positions shown; findings below may reference images not displayed]

FINDINGS: No acute fracture or dislocation.  Visualized portion of
the right hemithorax is normal.
IMPRESSION: No acute osseous abnormality.

## 2013-05-03 ENCOUNTER — Encounter (HOSPITAL_COMMUNITY): Payer: Self-pay | Admitting: Emergency Medicine

## 2013-05-03 ENCOUNTER — Emergency Department (INDEPENDENT_AMBULATORY_CARE_PROVIDER_SITE_OTHER): Payer: Worker's Compensation

## 2013-05-03 ENCOUNTER — Emergency Department (INDEPENDENT_AMBULATORY_CARE_PROVIDER_SITE_OTHER)
Admission: EM | Admit: 2013-05-03 | Discharge: 2013-05-03 | Disposition: A | Discharge status: Home / Self Care | Payer: BC Managed Care – PPO | Source: Home / Self Care | Attending: Emergency Medicine | Admitting: Emergency Medicine

## 2013-05-03 DIAGNOSIS — S93409A Sprain of unspecified ligament of unspecified ankle, initial encounter: Secondary | ICD-10-CM

## 2013-05-03 DIAGNOSIS — W101XXA Fall (on)(from) sidewalk curb, initial encounter: Secondary | ICD-10-CM

## 2013-05-03 MED ORDER — OXYCODONE-ACETAMINOPHEN 5-325 MG PO TABS: ORAL_TABLET | ORAL | Status: AC

## 2013-05-03 MED ORDER — HYDROCODONE-ACETAMINOPHEN 5-325 MG PO TABS
2.0000 | ORAL_TABLET | Freq: Once | ORAL | Status: AC
  Administered 2013-05-03: 2 via ORAL

## 2013-05-03 MED ORDER — HYDROCODONE-ACETAMINOPHEN 5-325 MG PO TABS
ORAL_TABLET | ORAL | Status: AC
  Filled 2013-05-03: qty 2

## 2013-05-03 NOTE — ED Provider Notes (Signed)
  Chief Complaint   Chief Complaint  Patient presents with  . Ankle Injury    History of Present Illness   Monica Mcdonald is a 28 year old female who today around 2 PM stepped off a curb, rolled her right ankle, and felt a pop. Ever since then she's had swelling over the lateral malleolus and cannot bear weight. The foot feels a little bit numb and tingly. She has injured that same ankle about a year ago.  Review of Systems   Other than as noted above, the patient denies any of the following symptoms: Systemic:  No fevers or chills.   Musculoskeletal:  No joint pain or swelling, back pain, or neck pain. Neurological:  No muscular weakness or paresthesias.  PMFSH   Past medical history, family history, social history, meds, and allergies were reviewed.   Physical Examination     Vital signs:  BP 138/91  Pulse 104  Temp(Src) 98.8 F (37.1 C) (Oral)  Resp 22  SpO2 99% Gen:  Alert and oriented times 3.  In no distress. Musculoskeletal: Exam of the ankle reveals there is swelling and pain to palpation over the lateral malleolus, ankle had a very limited range of motion with pain with just a few degrees of movement. Anterior drawer sign was negative.  Talar tilt negative. Squeeze test negative. Achilles tendon, peroneal tendon, and tibialis posterior were intact. Otherwise, all joints had a full a ROM with no swelling, bruising or deformity.  No edema, pulses full. Extremities were warm and pink.  Capillary refill was brisk.  Skin:  Clear, warm and dry.  No rash. Neuro:  Alert and oriented times 3.  Muscle strength was normal.  Sensation was intact to light touch.   Radiology   Dg Ankle Complete Right  05/03/2013   CLINICAL DATA:  Injury.  EXAM: RIGHT ANKLE - COMPLETE 3+ VIEW  COMPARISON:  None.  FINDINGS: Diffuse soft tissue swelling. No evidence of fracture or dislocation.  IMPRESSION: Diffuse soft tissue swelling.  No acute abnormality.   Electronically Signed   By: Maisie Fushomas  Register    On: 05/03/2013 15:43   I reviewed the images independently and personally and concur with the radiologist's findings.  [facility]     She has her own crutches and Cam Walker boot, so advised she use those.  Assessment   The encounter diagnosis was Ankle sprain.  Plan     1.  Meds:  The following meds were prescribed:   Discharge Medication List as of 05/03/2013  3:58 PM    START taking these medications   Details  oxyCODONE-acetaminophen (PERCOCET) 5-325 MG per tablet 1 to 2 tablets every 6 hours as needed for pain., Print        2.  Patient Education/Counseling:  The patient was given appropriate handouts, self care instructions, including rest and activity, elevation, application of ice and compression, and instructed in symptomatic relief.    3.  Follow up:  The patient was told to follow up here if no better in 3 to 4 days, or sooner if becoming worse in any way, and given some red flag symptoms such as increasing pain or neurological symptoms which would prompt immediate return.  Follow up with Dr. Beverely LowSteve Norris if no better in 2 weeks.     Reuben Likesavid C Mahina Salatino, MD 05/03/13 [phone]

## 2013-05-03 NOTE — ED Notes (Signed)
C/o left ankle injury due to stepping down off a curb States turned and twisted foot State she did injury the same ankle a year ago

## 2013-05-03 NOTE — Discharge Instructions (Signed)

## 2014-06-19 IMAGING — CR DG ANKLE COMPLETE 3+V*R*
3 series · 3 of 3 positions shown · non-contrast
Comparison: None.

CLINICAL DATA: Injury.

EXAM:
RIGHT ANKLE - COMPLETE 3+ VIEW

[view not recorded (1 of 3)]
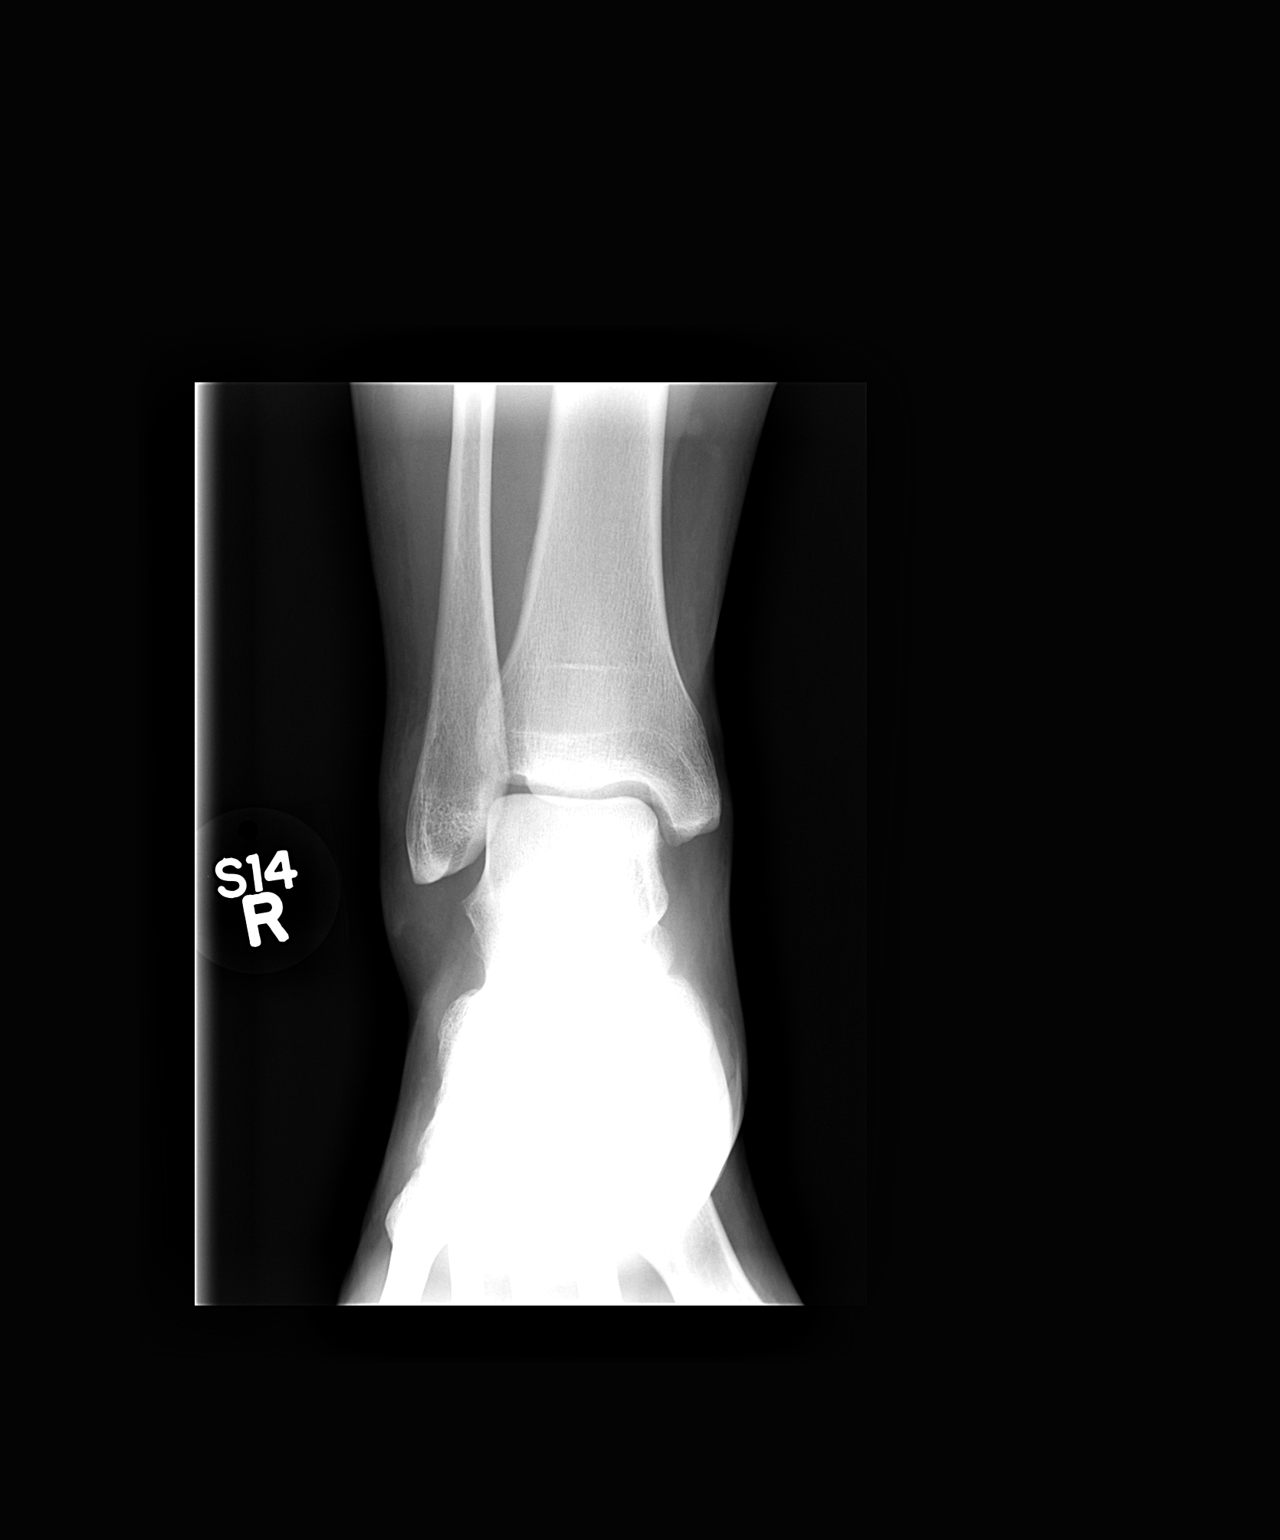

[view not recorded (2 of 3)]
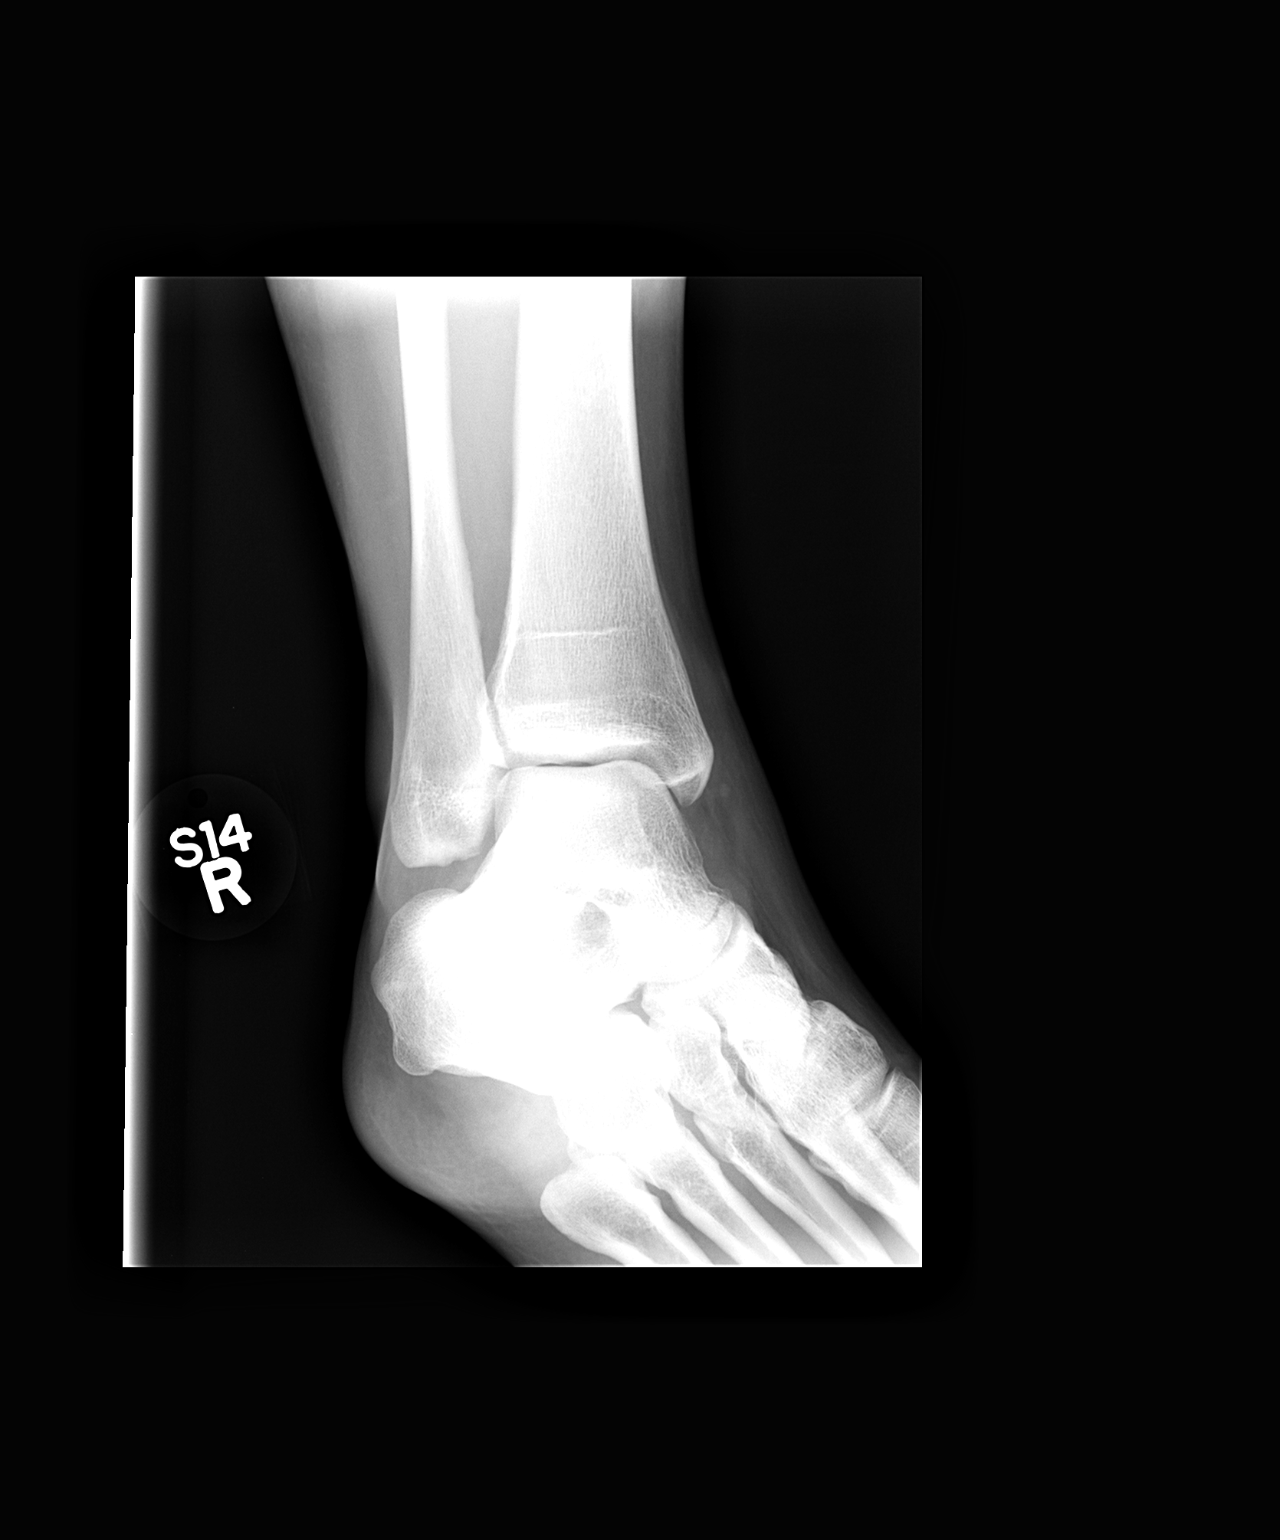

[view not recorded (3 of 3)]
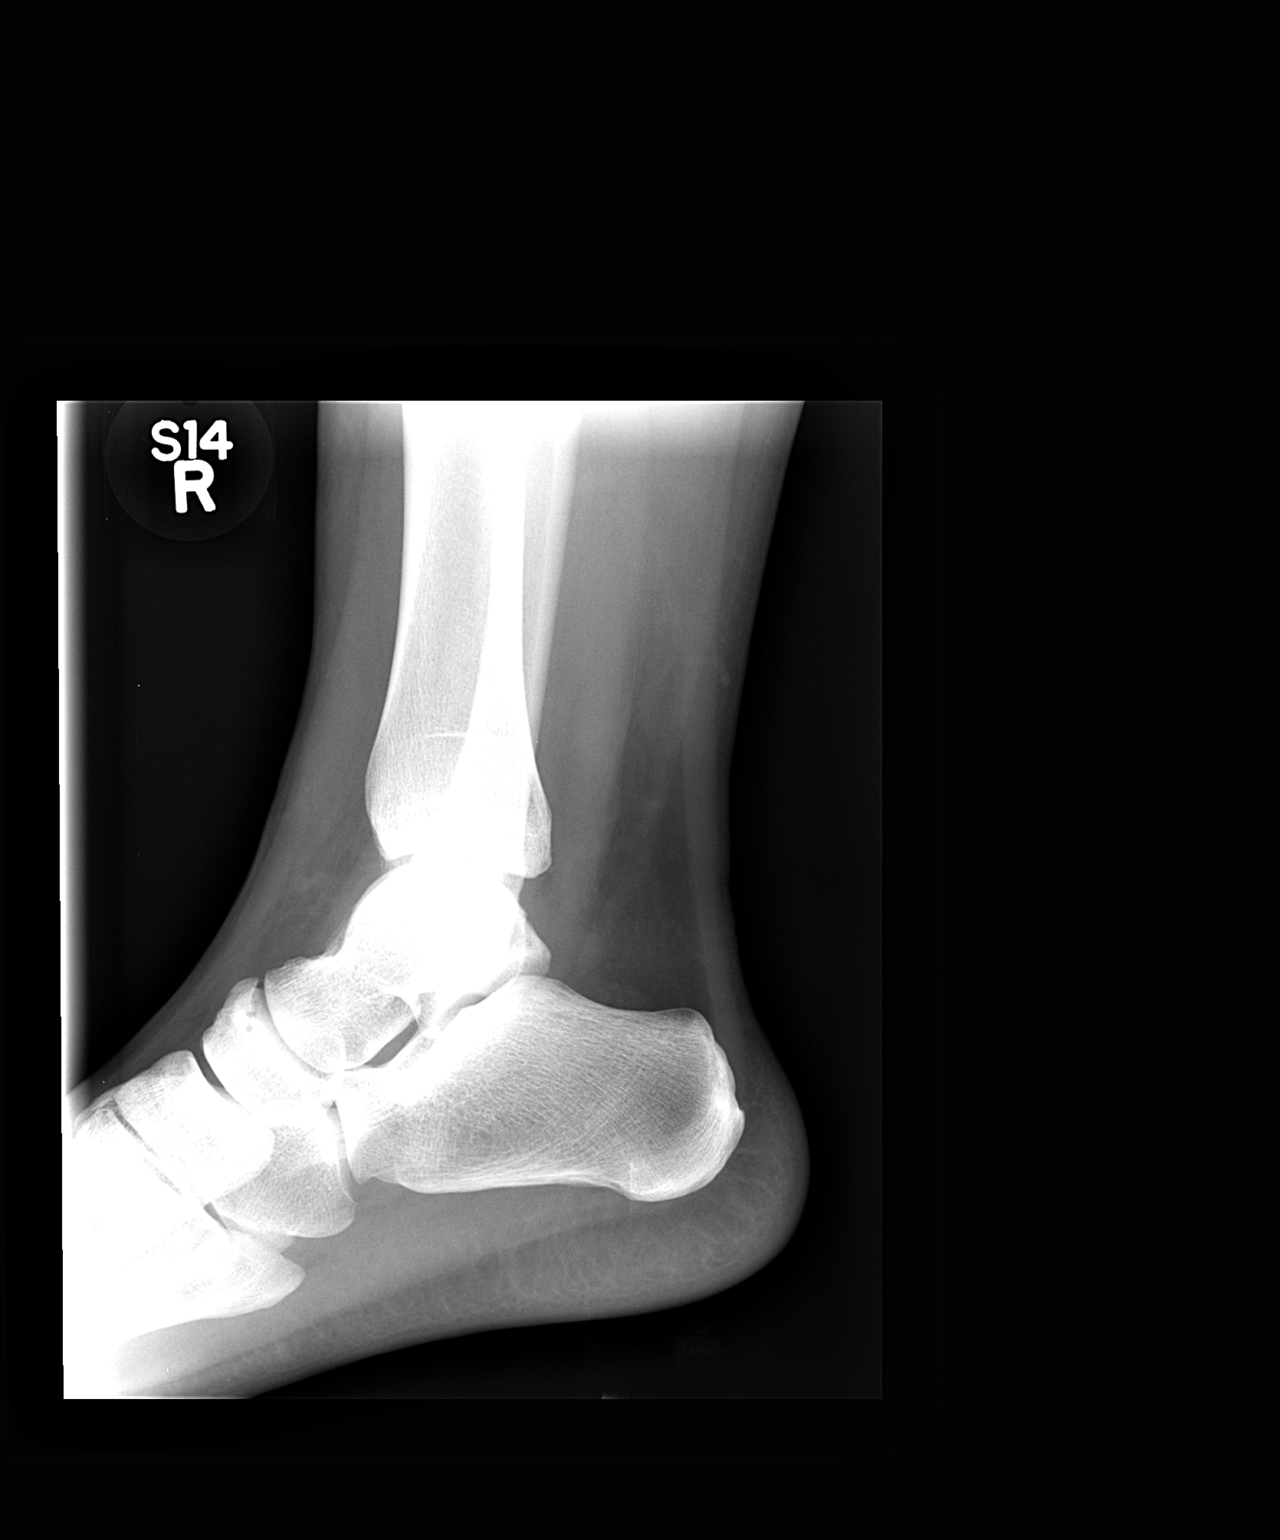

[3 of 3 positions shown; findings below may reference images not displayed]

FINDINGS: Diffuse soft tissue swelling. No evidence of fracture or
dislocation.
IMPRESSION: Diffuse soft tissue swelling.  No acute abnormality.
